# Patient Record
Sex: Female | Born: 1995 | Race: Black or African American | Hispanic: No | Marital: Single | Smoking: Never smoker
Health system: Southern US, Community
[De-identification: ages and names within clinical notes are randomized; demographics above are authoritative.]

---
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
## 2014-03-11 ENCOUNTER — Ambulatory Visit: Payer: Self-pay | Admitting: Family Medicine

## 2015-08-28 ENCOUNTER — Emergency Department (HOSPITAL_BASED_OUTPATIENT_CLINIC_OR_DEPARTMENT_OTHER)
Admission: EM | Admit: 2015-08-28 | Discharge: 2015-08-28 | Disposition: A | Discharge status: Home / Self Care | Payer: Medicaid Other | Attending: Emergency Medicine | Admitting: Emergency Medicine

## 2015-08-28 ENCOUNTER — Encounter (HOSPITAL_BASED_OUTPATIENT_CLINIC_OR_DEPARTMENT_OTHER): Payer: Self-pay

## 2015-08-28 DIAGNOSIS — M654 Radial styloid tenosynovitis [de Quervain]: Secondary | ICD-10-CM | POA: Insufficient documentation

## 2015-08-28 MED ORDER — NAPROXEN 500 MG PO TABS: 500.0000 mg | ORAL_TABLET | Freq: Two times a day (BID) | ORAL | Status: DC

## 2015-08-28 MED ORDER — NAPROXEN 250 MG PO TABS
500.0000 mg | ORAL_TABLET | Freq: Once | ORAL | Status: AC
  Administered 2015-08-28: 500 mg via ORAL
  Filled 2015-08-28: qty 2

## 2015-08-28 NOTE — ED Provider Notes (Signed)
CSN: [id number]     Arrival date & time 08/28/15  1342 History   First MD Initiated Contact with Patient 08/28/15 1348     Chief Complaint  Patient presents with  . Hand Pain     (Consider location/radiation/quality/duration/timing/severity/associated sxs/prior Treatment) The history is provided by the patient and medical records. No language interpreter was used.     Denise Kelley is a 20 y.o. female  with no major medical problems presents to the Emergency Department complaining of gradual, persistent, progressively worsening left hand pain onset 3 days ago.  Pt reports pain is moderate, located in the [facility] joint of the left thumb and worse with movement and palpation. Pt denies numbness or tinging to the fingers. She denies known trauma or injury.  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 She reports she has had intermittent episodes previously always occuring after several days of work in a row.  She reports the same happened this time as well, but her hand pain did not resolve spontaneously as it previously had.  No treatments prior to arrival. Patient denies fever, chills, joint swelling, numbness, tingling of the fingers, weakness of the hand or dropping objects. Patient is right-handed.  History reviewed. No pertinent past medical history. History reviewed. No pertinent past surgical history. No family history on file. Social History  Substance Use Topics  . Smoking status: Never Smoker   . Smokeless tobacco: None  . Alcohol Use: No   OB History    No data available     Review of Systems  Constitutional: Negative for fever and chills.  Gastrointestinal: Negative for nausea and vomiting.  Musculoskeletal: Positive for arthralgias (left thumb). Negative for back pain, joint swelling, neck pain and neck stiffness.  Skin: Negative for wound.  Neurological: Negative for numbness.  Hematological: Does not bruise/bleed easily.   Psychiatric/Behavioral: The patient is not nervous/anxious.   All other systems reviewed and are negative.     Allergies  Review of patient's allergies indicates no known allergies.  Home Medications   Prior to Admission medications   Medication Sig Start Date End Date Taking? Authorizing Provider  AMOXICILLIN PO Take 875 mg by mouth 2 (two) times daily. Ear infection x 1 week    Historical Provider, MD  diclofenac (VOLTAREN) 75 MG EC tablet Take 75 mg by mouth 2 (two) times daily.      Historical Provider, MD  naproxen (NAPROSYN) 500 MG tablet Take 1 tablet (500 mg total) by mouth 2 (two) times daily with a meal. 08/28/15   Cassey Bacigalupo, PA-C   BP 123/80 mmHg  Pulse 82  Temp(Src) 98.5 F (36.9 C) (Oral)  Resp 15  Ht 5' 9.5" (1.765 m)  Wt 74.844 kg  BMI 24.03 kg/m2  SpO2 100% Physical Exam  Constitutional: She appears well-developed and well-nourished. No distress.  HENT:  Head: Normocephalic and atraumatic.  Eyes: Conjunctivae are normal.  Neck: Normal range of motion.  Cardiovascular: Normal rate, regular rhythm and intact distal pulses.   Capillary refill < 3 sec  Pulmonary/Chest: Effort normal and breath sounds normal.  Musculoskeletal: She exhibits tenderness. She exhibits no edema.  ROM: FROM of the IP, MCP and CMC of the left thumb with pain on ROM and palpation of the CMC joint.  No swelling, erythema or increased warmth to the joints.  No swelling of the thenar eminence.   Negative Phalen's and Tinel's. Positive Finklestein's test and TTP along the  tendons of the extensor pollicis brevis and abductor pollicis longus  Neurological: She is alert. Coordination normal.  Sensation intact to dull and sharp in the left hand and arm Strength 5/5 in the left hand including flexion, extension and grip strength  Skin: Skin is warm and dry. She is not diaphoretic.  No tenting of the skin  Psychiatric: She has a normal mood and affect.  Nursing note and vitals  reviewed.   ED Course  Procedures (including critical care time)  MDM   Final diagnoses:  De Quervain's tenosynovitis, left   Denise Kelley presents with pain over the [facility] joint of the left thumb. Clinical findings are consistent with de Quervain's tenosynovitis. No evidence of carpal tunnel. No trauma, highly doubt fracture. No x-ray indicated at this time.  Patient denies IV drug use; no indication to suggest septic joint. Patient will be given naproxen and thumb spica. She is to follow-up with sports medicine.  Dahlia ClientHannah Shaketha Jeon, PA-C 08/28/15 1712  Jacalyn LefevreJulie Haviland, MD 08/29/15 [phone]

## 2015-08-28 NOTE — Discharge Instructions (Signed)
1. Medications: alternate naprosyn for pain control, usual home medications 2. Treatment: rest, ice, elevate and use brace, drink plenty of fluids, gentle stretching 3. Follow Up: Please followup with orthopedics as directed or your PCP in 1 week if no improvement for discussion of your diagnoses and further evaluation after today's visit; if you do not have a primary care doctor use the resource guide provided to find one; Please return to the ER for worsening symptoms or other concerns

## 2015-08-28 NOTE — ED Notes (Signed)
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

## 2016-04-25 ENCOUNTER — Ambulatory Visit: Payer: Self-pay | Admitting: Family Medicine

## 2016-04-29 ENCOUNTER — Encounter: Payer: Self-pay | Admitting: Family Medicine

## 2016-04-29 ENCOUNTER — Ambulatory Visit (INDEPENDENT_AMBULATORY_CARE_PROVIDER_SITE_OTHER): Payer: BLUE CROSS/BLUE SHIELD | Admitting: Family Medicine

## 2016-04-29 VITALS — BP 119/84 | HR 93 | Temp 98.4°F | Ht 69.5 in | Wt 181.2 lb

## 2016-04-29 DIAGNOSIS — L309 Dermatitis, unspecified: Secondary | ICD-10-CM

## 2016-04-29 DIAGNOSIS — M25561 Pain in right knee: Secondary | ICD-10-CM | POA: Diagnosis not present

## 2016-04-29 DIAGNOSIS — G8929 Other chronic pain: Secondary | ICD-10-CM

## 2016-04-29 DIAGNOSIS — M25562 Pain in left knee: Secondary | ICD-10-CM

## 2016-04-29 DIAGNOSIS — N926 Irregular menstruation, unspecified: Secondary | ICD-10-CM

## 2016-04-29 DIAGNOSIS — Z130 Encounter for screening for diseases of the blood and blood-forming organs and certain disorders involving the immune mechanism: Secondary | ICD-10-CM

## 2016-04-29 DIAGNOSIS — Z23 Encounter for immunization: Secondary | ICD-10-CM | POA: Diagnosis not present

## 2016-04-29 DIAGNOSIS — Z13220 Encounter for screening for lipoid disorders: Secondary | ICD-10-CM

## 2016-04-29 DIAGNOSIS — R700 Elevated erythrocyte sedimentation rate: Secondary | ICD-10-CM

## 2016-04-29 DIAGNOSIS — Z131 Encounter for screening for diabetes mellitus: Secondary | ICD-10-CM | POA: Diagnosis not present

## 2016-04-29 DIAGNOSIS — Z113 Encounter for screening for infections with a predominantly sexual mode of transmission: Secondary | ICD-10-CM

## 2016-04-29 DIAGNOSIS — Z789 Other specified health status: Secondary | ICD-10-CM

## 2016-04-29 LAB — POCT URINE PREGNANCY: PREG TEST UR: NEGATIVE

## 2016-04-29 LAB — COMPREHENSIVE METABOLIC PANEL
ALBUMIN: 4.4 g/dL (ref 3.5–5.2)
ALT: 12 U/L (ref 0–35)
AST: 16 U/L (ref 0–37)
Alkaline Phosphatase: 62 U/L (ref 39–117)
BILIRUBIN TOTAL: 0.4 mg/dL (ref 0.2–1.2)
BUN: 8 mg/dL (ref 6–23)
CALCIUM: 9.6 mg/dL (ref 8.4–10.5)
CO2: 28 mEq/L (ref 19–32)
CREATININE: 0.8 mg/dL (ref 0.40–1.20)
Chloride: 102 mEq/L (ref 96–112)
GFR: 115.07 mL/min (ref >60.00)
Glucose, Bld: 92 mg/dL (ref 70–99)
Potassium: 3.5 mEq/L (ref 3.5–5.1)
Sodium: 138 mEq/L (ref 135–145)
TOTAL PROTEIN: 8 g/dL (ref 6.0–8.3)

## 2016-04-29 LAB — LIPID PANEL
CHOLESTEROL: 164 mg/dL (ref 0–200)
HDL: 29.9 mg/dL — ABNORMAL LOW (ref >39.00)
LDL Cholesterol: 116 mg/dL — ABNORMAL HIGH (ref 0–99)
NonHDL: 134.17
TRIGLYCERIDES: 89 mg/dL (ref 0.0–149.0)
Total CHOL/HDL Ratio: 5
VLDL: 17.8 mg/dL (ref 0.0–40.0)

## 2016-04-29 LAB — SEDIMENTATION RATE: SED RATE: 23 mm/h — AB (ref 0–20)

## 2016-04-29 LAB — RHEUMATOID FACTOR

## 2016-04-29 LAB — CBC
HCT: 39.1 % (ref 36.0–46.0)
Hemoglobin: 12.9 g/dL (ref 12.0–15.0)
MCHC: 33.1 g/dL (ref 30.0–36.0)
MCV: 80.2 fl (ref 78.0–100.0)
PLATELETS: 302 10*3/uL (ref 150.0–400.0)
RBC: 4.87 Mil/uL (ref 3.87–5.11)
RDW: 15.9 % — ABNORMAL HIGH (ref 11.5–15.5)
WBC: 5.3 10*3/uL (ref 4.0–10.5)

## 2016-04-29 LAB — C-REACTIVE PROTEIN: CRP: 0.4 mg/dL — AB (ref 0.5–20.0)

## 2016-04-29 MED ORDER — HYDROCORTISONE 2.5 % EX OINT: TOPICAL_OINTMENT | Freq: Two times a day (BID) | CUTANEOUS | 2 refills | Status: DC

## 2016-04-29 NOTE — Patient Instructions (Addendum)
It was very nice to see you today.  I will be in touch with your labs asap We will look for any cause of your joint pains in your labs.  Assuming all looks well we can have you try physical therapy if you like for your joints You got your tetanus booster today (good for 10 years) and you flu shot  Please schedule a visit to see me for your pap at your convenience.   However this only needs to be done every 3 years per the latest recommendations so this is not an emergency.    Take care!
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
## 2016-04-30 ENCOUNTER — Encounter: Payer: Self-pay | Admitting: Family Medicine

## 2016-04-30 DIAGNOSIS — Z789 Other specified health status: Secondary | ICD-10-CM | POA: Insufficient documentation

## 2016-04-30 LAB — HEPATITIS B SURFACE ANTIGEN: HEP B S AG: NEGATIVE

## 2016-04-30 LAB — HIV ANTIBODY (ROUTINE TESTING W REFLEX): HIV 1&2 Ab, 4th Generation: NONREACTIVE

## 2016-04-30 LAB — HEPATITIS C ANTIBODY: HCV AB: NEGATIVE

## 2016-04-30 LAB — RPR

## 2016-04-30 LAB — HEPATITIS B SURFACE ANTIBODY, QUANTITATIVE: Hepatitis B-Post: 52.9 m[IU]/mL

## 2016-04-30 NOTE — Progress Notes (Signed)
Results for orders placed or performed in visit on 04/29/16  CBC  Result Value Ref Range   WBC 5.3 4.0 - 10.5 K/uL   RBC 4.87 3.87 - 5.11 Mil/uL   Platelets 302.0 150.0 - 400.0 K/uL   Hemoglobin 12.9 12.0 - 15.0 g/dL   HCT 16.139.1 09.636.0 - 04.546.0 %   MCV 80.2 78.0 - 100.0 fl   MCHC 33.1 30.0 - 36.0 g/dL   RDW 40.915.9 (H) 81.111.5 - 91.415.5 %  Comprehensive metabolic panel  Result Value Ref Range   Sodium 138 135 - 145 mEq/L   Potassium 3.5 3.5 - 5.1 mEq/L   Chloride 102 96 - 112 mEq/L   CO2 28 19 - 32 mEq/L   Glucose, Bld 92 70 - 99 mg/dL   BUN 8 6 - 23 mg/dL   Creatinine, Ser 7.820.80 0.40 - 1.20 mg/dL   Total Bilirubin 0.4 0.2 - 1.2 mg/dL   Alkaline Phosphatase 62 39 - 117 U/L   AST 16 0 - 37 U/L   ALT 12 0 - 35 U/L   Total Protein 8.0 6.0 - 8.3 g/dL   Albumin 4.4 3.5 - 5.2 g/dL   Calcium 9.6 8.4 - 95.610.5 mg/dL   GFR 213.08115.07 >65.78>60.00 mL/min  Lipid panel  Result Value Ref Range   Cholesterol 164 0 - 200 mg/dL   Triglycerides 46.989.0 0.0 - 149.0 mg/dL   HDL 62.9529.90 (L) >28.41>39.00 mg/dL   VLDL 32.417.8 0.0 - 40.140.0 mg/dL   LDL Cholesterol 027116 (H) 0 - 99 mg/dL   Total CHOL/HDL Ratio 5    NonHDL 134.17   C-reactive protein  Result Value Ref Range   CRP 0.4 (L) 0.5 - 20.0 mg/dL  Rheumatoid Factor  Result Value Ref Range   Rhuematoid fact SerPl-aCnc <14 <14 IU/mL  Sedimentation rate  Result Value Ref Range   Sed Rate 23 (H) 0 - 20 mm/hr  Hepatitis B surface antibody  Result Value Ref Range   Hepatitis B-Post 52.9 mIU/mL  Hepatitis B surface antigen  Result Value Ref Range   Hepatitis B Surface Ag NEGATIVE NEGATIVE  Hepatitis C antibody  Result Value Ref Range   HCV Ab NEGATIVE NEGATIVE  HIV antibody  Result Value Ref Range   HIV 1&2 Ab, 4th Generation NONREACTIVE NONREACTIVE  RPR  Result Value Ref Range   RPR Ser Ql NON REAC NON REAC  POCT urine pregnancy  Result Value Ref Range   Preg Test, Ur Negative Negative

## 2016-04-30 NOTE — Addendum Note (Signed)
Addended by: Abbe AmsterdamOPLAND, Rommie Dunn C on: 04/30/2016 07:59 AM   Modules accepted: Orders

## 2016-05-07 ENCOUNTER — Telehealth: Payer: Self-pay | Admitting: Family Medicine

## 2016-05-07 NOTE — Telephone Encounter (Signed)
Attempted to contact patient 2/6 @ 11:00 A.M. @ [phone] to inform her that Dr. Patsy Lageropland will be out of the office on 2/15 and  we need to reschedule her appointment. Telephone number was not accepting calls. Could not leave message.

## 2016-05-16 ENCOUNTER — Ambulatory Visit: Payer: BLUE CROSS/BLUE SHIELD | Admitting: Family Medicine

## 2016-06-05 ENCOUNTER — Ambulatory Visit (INDEPENDENT_AMBULATORY_CARE_PROVIDER_SITE_OTHER): Payer: BLUE CROSS/BLUE SHIELD | Admitting: Family Medicine

## 2016-06-05 ENCOUNTER — Ambulatory Visit: Payer: BLUE CROSS/BLUE SHIELD | Admitting: Family Medicine

## 2016-06-05 ENCOUNTER — Other Ambulatory Visit (HOSPITAL_COMMUNITY)
Admission: RE | Admit: 2016-06-05 | Discharge: 2016-06-05 | Disposition: A | Discharge status: Home / Self Care | Payer: BLUE CROSS/BLUE SHIELD | Source: Ambulatory Visit | Attending: Family Medicine | Admitting: Family Medicine

## 2016-06-05 VITALS — BP 126/82 | HR 86 | Temp 98.8°F | Ht 69.5 in | Wt 182.4 lb

## 2016-06-05 DIAGNOSIS — Z124 Encounter for screening for malignant neoplasm of cervix: Secondary | ICD-10-CM

## 2016-06-05 DIAGNOSIS — Z113 Encounter for screening for infections with a predominantly sexual mode of transmission: Secondary | ICD-10-CM | POA: Diagnosis not present

## 2016-06-05 DIAGNOSIS — Z01419 Encounter for gynecological examination (general) (routine) without abnormal findings: Secondary | ICD-10-CM | POA: Insufficient documentation

## 2016-06-05 NOTE — Progress Notes (Signed)
[facility] [address], [address] [place], [place] [address] [phone] [phone]  Date:  06/05/2016   Name:  Denise ReamerMarissa S Callies   DOB:  [birth year]   MRN:  [id number]  PCP:  Abbe AmsterdamOPLAND,Esty Ahuja, MD    Chief Complaint: Gynecologic Exam (Pt here for PAP)   History of Present Illness:  Denise Kelley is a 21 y.o. very pleasant female patient who presents with the following:  Here today for a follow-up visit and pap - last visit HPI as follows:  Here today as a new patient to establish care.  She just graduated from [employer]- she is getting an Set designerMBA from [employer] She needs a flu shot Her last tetanus was in 2008- tdap. Will update with td today  Her last pap was in 2016- never had an abnl pap Her LMP was in early December. She is SA- they use condoms for contraception She likes using condoms and does not wish to have another form of contraception at this time  Here today seeking a routine pap smear She has no vaginal or urinary symptoms or other concerns at this time LMP was 05/21/16 She has no concerns about her breasts  She is currently on spring break from school and [place]  Patient Active Problem List   Diagnosis Date Noted  . Hepatitis B immune 04/30/2016  . Eczema 04/29/2016    No past medical history on file.  No past surgical history on file.  Social History  Substance Use Topics  . Smoking status: Never Smoker  . Smokeless tobacco: Not on file  . Alcohol use No    Family History  Problem Relation Age of Onset  . Hypertension Mother   . Migraines Father   . Breast cancer Paternal Grandmother     No Known Allergies  Medication list has been reviewed and updated.  Current Outpatient Prescriptions on File Prior to Visit  Medication Sig Dispense Refill  . hydrocortisone 2.5 % ointment Apply topically 2 (two) times daily. Use as needed for eczema 90 g 2   No  current facility-administered medications on file prior to visit.     Review of Systems:  As per HPI- otherwise negative.   Physical Examination: Vitals:   06/05/16 1004  BP: 126/82  Pulse: 86  Temp: 98.8 F (37.1 C)   Vitals:   06/05/16 1004  Weight: 182 lb 6.4 oz (82.7 kg)  Height: 5' 9.5" (1.765 m)   Body mass index is 26.55 kg/m. Ideal Body Weight: Weight in (lb) to have BMI = 25: 171.4  GEN: WDWN, NAD, Non-toxic, A & O x 3,tall build, looks well HEENT: Atraumatic, Normocephalic. Neck supple. No masses, No LAD. Ears and Nose: No external deformity. CV: RRR, No M/G/R. No JVD. No thrill. No extra heart sounds. PULM: CTA B, no wheezes, crackles, rhonchi. No retractions. No resp. distress. No accessory muscle use. ABD: S, NT, ND EXTR: No c/c/e NEURO Normal gait.  PSYCH: Normally interactive. Conversant. Not depressed or anxious appearing.  Calm demeanor.  Pelvic: normal, no vaginal lesions or discharge. Uterus normal, no CMT, no adnexal tendereness or masses  Assessment and Plan: Screening for cervical cancer - Plan: Cytology - PAP  Pap today- will be in touch with her pending results Did gonorrhea and chlamydia screening as well  Signed Abbe AmsterdamJessica Ionia Schey, MD

## 2016-06-05 NOTE — Patient Instructions (Signed)
Good to see you today- enjoy your spring break!  I will be in touch with your pap asap

## 2016-06-07 LAB — CYTOLOGY - PAP
Chlamydia: NEGATIVE
DIAGNOSIS: NEGATIVE
NEISSERIA GONORRHEA: NEGATIVE

## 2016-06-08 ENCOUNTER — Encounter: Payer: Self-pay | Admitting: Family Medicine

## 2016-10-15 ENCOUNTER — Telehealth: Payer: Self-pay | Admitting: Family Medicine

## 2016-10-15 ENCOUNTER — Ambulatory Visit (INDEPENDENT_AMBULATORY_CARE_PROVIDER_SITE_OTHER)
Admission: RE | Admit: 2016-10-15 | Discharge: 2016-10-15 | Disposition: A | Discharge status: Home / Self Care | Payer: BLUE CROSS/BLUE SHIELD | Source: Ambulatory Visit | Attending: Family Medicine | Admitting: Family Medicine

## 2016-10-15 ENCOUNTER — Ambulatory Visit (INDEPENDENT_AMBULATORY_CARE_PROVIDER_SITE_OTHER): Payer: BLUE CROSS/BLUE SHIELD | Admitting: Family Medicine

## 2016-10-15 ENCOUNTER — Encounter: Payer: Self-pay | Admitting: Family Medicine

## 2016-10-15 ENCOUNTER — Encounter: Payer: Self-pay | Admitting: *Deleted

## 2016-10-15 VITALS — BP 100/60 | HR 92 | Temp 98.8°F | Ht 69.5 in | Wt 189.4 lb

## 2016-10-15 DIAGNOSIS — M791 Myalgia, unspecified site: Secondary | ICD-10-CM

## 2016-10-15 DIAGNOSIS — R0789 Other chest pain: Secondary | ICD-10-CM

## 2016-10-15 DIAGNOSIS — F419 Anxiety disorder, unspecified: Secondary | ICD-10-CM | POA: Diagnosis not present

## 2016-10-15 DIAGNOSIS — R0602 Shortness of breath: Secondary | ICD-10-CM | POA: Diagnosis not present

## 2016-10-15 LAB — CBC
HCT: 38.1 % (ref 36.0–46.0)
Hemoglobin: 12.5 g/dL (ref 12.0–15.0)
MCHC: 32.9 g/dL (ref 30.0–36.0)
MCV: 80.6 fl (ref 78.0–100.0)
PLATELETS: 303 10*3/uL (ref 150.0–400.0)
RBC: 4.72 Mil/uL (ref 3.87–5.11)
RDW: 15.8 % — AB (ref 11.5–15.5)
WBC: 7.1 10*3/uL (ref 4.0–10.5)

## 2016-10-15 LAB — TSH: TSH: 1.72 u[IU]/mL (ref 0.35–4.50)

## 2016-10-15 MED ORDER — ALBUTEROL SULFATE HFA 108 (90 BASE) MCG/ACT IN AERS: 2.0000 | INHALATION_SPRAY | Freq: Four times a day (QID) | RESPIRATORY_TRACT | 0 refills | Status: DC | PRN

## 2016-10-15 NOTE — Telephone Encounter (Signed)
[facility] Day - Client TELEPHONE ADVICE RECORD [facility] Patient Name: Denise Kelley DOB: 06/14/1995 Initial Comment Caller states c/o chest tightness and shortness of breath. Nurse Assessment Nurse: Lane HackerHarley, RN, Elvin SoWindy Date/Time (Eastern Time): 10/15/2016 9:02:41 AM Confirm and document reason for call. If symptomatic, describe symptoms. ---Caller states c/o chest tightness and shortness of breath off/on for last 2 wks ago. This episode started this AM, and still feels it now. Does the patient have any new or worsening symptoms? ---Yes Will a triage be completed? ---Yes Related visit to physician within the last 2 weeks? ---No Does the PT have any chronic conditions? (i.e. diabetes, asthma, etc.) ---No Is the patient pregnant or possibly pregnant? (Ask all females between the ages of 112-55) ---No Is this a behavioral health or substance abuse call? ---No Guidelines Guideline Title Affirmed Question Affirmed Notes Chest Pain Chest pain lasts > 5 minutes (Exceptions: chest pain occurring > 3 days ago and now asymptomatic; same as previously diagnosed heartburn and has accompanying sour taste in mouth) Final Disposition User Go to ED Now (or PCP triage) Lane HackerHarley, RN, Elvin SoWindy Comments Denies difficulty breathing now. [facility] as [facility]. Disagree/Comply: Comply

## 2016-10-15 NOTE — Progress Notes (Signed)
HPI:  Acute visit for chest discomfort: -intermittent for 2 weeks, tightness in upper chest, no symptoms today -usually occur in the morning a rest, occasionally when walking to class -under a lot of stress with working and school, has anxiety and occ panic -feels mild SOB at times as well -no medications, recent immobility, recent change in activities, CP, dizziness, fever, cough, hemoptysis, malaise, depression or SI, palpitations -hx irr periods, denies any chance pregnancy, FDLMP end of June -denies hx asthma or allergies   ROS: See pertinent positives and negatives per HPI.  No past medical history on file.  No past surgical history on file.  Family History  Problem Relation Age of Onset  . Hypertension Mother   . Migraines Father   . Breast cancer Paternal Grandmother     Social History   Social History  . Marital status: Single    Spouse name: N/A  . Number of children: N/A  . Years of education: N/A   Social History Main Topics  . Smoking status: Never Smoker  . Smokeless tobacco: Never Used  . Alcohol use No  . Drug use: No  . Sexual activity: Not Asked   Other Topics Concern  . None   Social History Narrative  . None     Current Outpatient Prescriptions:  .  hydrocortisone 2.5 % ointment, Apply topically 2 (two) times daily. Use as needed for eczema, Disp: 90 g, Rfl: 2 .  albuterol (PROVENTIL HFA;VENTOLIN HFA) 108 (90 Base) MCG/ACT inhaler, Inhale 2 puffs into the lungs every 6 (six) hours as needed., Disp: 1 Inhaler, Rfl: 0  EXAM:  Vitals:   10/15/16 1034  BP: 100/60  Pulse: 92  Temp: 98.8 F (37.1 C)    Body mass index is 27.57 kg/m.  GENERAL: vitals reviewed and listed above, alert, oriented, appears well hydrated and in no acute distress  HEENT: atraumatic, conjunttiva clear, no obvious abnormalities on inspection of external nose and ears, normal appearance of ear canals and TMs, clear nasal congestion, boggy turbinates, mild post  oropharyngeal erythema with PND, no tonsillar edema or exudate, no sinus TTP  NECK: no obvious masses on inspection  LUNGS: clear to auscultation bilaterally, no wheezes, rales or rhonchi, good air movement  CV: HRRR, no peripheral edema  MS: moves all extremities without noticeable abnormality, TTP in upper chest wall muscles bilaterally in area of concern  PSYCH: pleasant and cooperative, no obvious depression or anxiety  ASSESSMENT AND PLAN:  Discussed the following assessment and plan:  Chest tightness - Plan: EKG 12-Lead, TSH  SOB (shortness of breath) - Plan: CBC, DG Chest 2 View  Anxiety  Muscle soreness  -we discussed possible serious and likely etiologies, workup and treatment, treatment risks and return precautions -some reproducible chest wall muscle soreness, anxiety and findings suggesting allergies or VURI on exam - all which could be contributing -after this discussion, Denise Kelley opted for: EKG (NSR on interpretation), CXR, CBC, TSH; zyrtec and trial prn alb in case viral or allergic related RAD, consideration CBT - options discussed and resources provided, stretching and close follow up with PCP -follow up advised in 2 weeks with PCP -of course, we advised Denise Kelley  to return or notify a doctor immediately if symptoms worsen or persist or new concerns arise.   Patient Instructions  BEFORE YOU LEAVE: -EKG, xray sheet -brochure for counseling to help with stress/anxiety -labs -follow up: with PCP in 2 weeks  We have ordered labs or studies at this visit. It can  take up to 1-2 weeks for results and processing. IF results require follow up or explanation, we will call you with instructions. Clinically stable results will be released to your [facility]. If you have not heard from us or cannot find your results in [facility] in 2 weeks please contact our office at [phone].  If you are not yet signed up for [facility], please consider signing up.  Consider counseling to  help with anxiety and stress - your school may also offer this.  Take zyrtec daily at night and try the albuterol if any further chest tightness to see if this helps.  Stretching for sore muscles.  I hope you are feeling better soon! Seek care sooner if worsening, new concerns or you are not improving with treatment.     Kriste BasqueKIM, Denise Kelley R., DO

## 2016-10-15 NOTE — Patient Instructions (Signed)
BEFORE YOU LEAVE: -EKG, xray sheet -brochure for counseling to help with stress/anxiety -labs -follow up: with PCP in 2 weeks  We have ordered labs or studies at this visit. It can take up to 1-2 weeks for results and processing. IF results require follow up or explanation, we will call you with instructions. Clinically stable results will be released to your [facility]MYCHART. If you have not heard from us or cannot find your results in [facility] in 2 weeks please contact our office at [phone].  If you are not yet signed up for [facility], please consider signing up.  Consider counseling to help with anxiety and stress - your school may also offer this.  Take zyrtec daily at night and try the albuterol if any further chest tightness to see if this helps.  Stretching for sore muscles.  I hope you are feeling better soon! Seek care sooner if worsening, new concerns or you are not improving with treatment.

## 2016-10-15 NOTE — Telephone Encounter (Signed)
Error

## 2017-06-08 NOTE — Progress Notes (Addendum)
[facility] [address], [address] [place], [place] [address] [phone] [phone]  Date:  06/09/2017   Name:  Denise Kelley   DOB:  Mar 25, 1996   MRN:  [id number]  PCP:  Pearline Cablesopland, [place] C, MD    Chief Complaint: Annual Exam (Pt here for CPE with PAP. )   History of Present Illness:  Denise Kelley is a 22 y.o. 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  She is graduating with her masters this spring from [employer]  She wanted an HCG today as she may have missed a cycle- she had her LMP 2/1- 2/5- sometimes she can be irregular She is using condoms right now for contraception- she uses them not all the time however.  She does not wish to be on any other form on contraception at this time. Discussed with her in detail and advised that   She is fasting today-  She started exercising recently  Noted low HDL last year   She notes that she tends to have a lot of nasal discharge/ congestion and PND.  Will have her try atrovent nasal   Patient Active Problem List   Diagnosis Date Noted  . Hepatitis B immune 04/30/2016  . Eczema 04/29/2016    History reviewed. No pertinent past medical history.  History reviewed. No pertinent surgical history.  Social History   Tobacco Use  . Smoking status: Never Smoker  . Smokeless tobacco: Never Used  Substance Use Topics  . Alcohol use: No  . Drug use: No    Family History  Problem Relation Age of Onset  . Hypertension Mother   . Migraines Father   . Breast cancer Paternal Grandmother     No Known Allergies  Medication list has been reviewed and updated.  No current outpatient medications on file prior to visit.   No current facility-administered medications on file prior to visit.      Review of Systems:  As per HPI- otherwise negative.   Physical Examination: Vitals:   06/09/17 1040  BP: 139/82  Pulse: 82  Temp: 98.7 F (37.1 C)  SpO2: 100%   Vitals:   06/09/17 1040  Weight: 194 lb (88 kg)  Height: 5\' 10"  (1.778 m)   Body mass index is 27.84 kg/m. Ideal Body Weight: Weight in (lb) to have BMI = 25: 173.9  GEN: WDWN, NAD, Non-toxic, A & O x 3, looks well, normal weight  HEENT: Atraumatic, Normocephalic. Neck supple. No masses, No LAD.  Bilateral TM wnl, oropharynx normal.  PEERL,EOMI.   Ears and Nose: No external deformity. CV: RRR, No M/G/R. No JVD. No thrill. No extra heart sounds. PULM: CTA B, no wheezes, crackles, rhonchi. No retractions. No resp. distress. No accessory muscle use. ABD: S, NT, ND, +BS. No rebound. No HSM. EXTR: No c/c/e NEURO Normal gait.  PSYCH: Normally interactive. Conversant. Not depressed or anxious appearing.  Calm demeanor.  Breast: normal exam, no masses/ dimpling/ discharge Pelvic: normal, no vaginal lesions or discharge. Uterus normal, no CMT, no adnexal tendereness or masses  Results for orders placed or performed in visit on 06/09/17  POCT urine pregnancy  Result Value Ref Range   Preg Test, Ur Negative Negative  Assessment and Plan: Physical exam  Eczema, unspecified type - Plan: hydrocortisone 2.5 % ointment  Missed period - Plan: POCT urine pregnancy  Routine screening for STI (sexually transmitted infection) - Plan: Hepatitis C antibody, HIV antibody, RPR, Cervicovaginal ancillary only  Screening for diabetes mellitus - Plan: Hemoglobin A1c  Screening for hyperlipidemia - Plan: Lipid panel  Post-nasal drainage - Plan: ipratropium (ATROVENT) 0.03 % nasal spray  CPE today Labs pending as above Will plan further follow- up pending labs. Gave her atrovent nasal to try for her nasal sx Will plan further follow- up pending labs. See patient instructions for more details.     Signed Abbe Amsterdam, MD  Received her labs 3/12  Negative for Hep C, HIV, syphilis.   Your cholesterol is overall ok, but I would like to see your HDL (good cholesterol) a bit higher.  Exercise and an omega 3 supplement can be helpful here.  Also, your A1c (average blood sugar) is in the pre-diabetes range.  This means you may be at higher risk for diabetes later on.  Again, exercise can help, as can losing just a few pounds.  Let's meet in 6 months to repeat your A1c test.   Received her remaining labs 3/12 Positive for chlamydia Message to pt again: Hi Shawn- I'm afraid your last lab came back and you do have chlamydia. This is curable with antibiotics.  Please reply to me so that I know you got this message.  Then I will send in antibiotics for you.   Also, any partner(s) needs to be treated or you will catch this again.  Both you and any partners need to complete treatment and then wait 10 days prior to intercourse.    Please just reply to me here, and please confirm the pharmacy you would like to use! JC Results for orders placed or performed in visit on 06/09/17  Hepatitis C antibody  Result Value Ref Range   Hepatitis C Ab NON-REACTIVE NON-REACTI   SIGNAL TO CUT-OFF 0.02 <1.00  HIV antibody  Result Value Ref Range   HIV 1&2 Ab, 4th Generation NON-REACTIVE NON-REACTI  RPR  Result Value Ref Range   RPR Ser Ql NON-REACTIVE NON-REACTI  Lipid panel  Result Value Ref Range   Cholesterol 140 0 - 200 mg/dL   Triglycerides 161.0 0.0 - 149.0 mg/dL   HDL 96.04 (L) >54.09 mg/dL   VLDL 81.1 0.0 - 91.4 mg/dL   LDL Cholesterol 81 0 - 99 mg/dL   Total CHOL/HDL Ratio 4    NonHDL 107.79   Hemoglobin A1c  Result Value Ref Range   Hgb A1c MFr Bld 6.2 4.6 - 6.5 %  POCT urine pregnancy  Result Value Ref Range   Preg Test, Ur Negative Negative  Cervicovaginal ancillary only  Result Value Ref Range   Chlamydia **POSITIVE** (A)    Neisseria gonorrhea Negative    Trichomonas Negative

## 2017-06-09 ENCOUNTER — Encounter: Payer: Self-pay | Admitting: Family Medicine

## 2017-06-09 ENCOUNTER — Other Ambulatory Visit (HOSPITAL_COMMUNITY)
Admission: RE | Admit: 2017-06-09 | Discharge: 2017-06-09 | Disposition: A | Discharge status: Home / Self Care | Payer: BLUE CROSS/BLUE SHIELD | Source: Ambulatory Visit | Attending: Family Medicine | Admitting: Family Medicine

## 2017-06-09 ENCOUNTER — Ambulatory Visit (INDEPENDENT_AMBULATORY_CARE_PROVIDER_SITE_OTHER): Payer: BLUE CROSS/BLUE SHIELD | Admitting: Family Medicine

## 2017-06-09 VITALS — BP 139/82 | HR 82 | Temp 98.7°F | Ht 70.0 in | Wt 194.0 lb

## 2017-06-09 DIAGNOSIS — Z113 Encounter for screening for infections with a predominantly sexual mode of transmission: Secondary | ICD-10-CM | POA: Diagnosis not present

## 2017-06-09 DIAGNOSIS — N926 Irregular menstruation, unspecified: Secondary | ICD-10-CM

## 2017-06-09 DIAGNOSIS — Z0000 Encounter for general adult medical examination without abnormal findings: Secondary | ICD-10-CM | POA: Diagnosis not present

## 2017-06-09 DIAGNOSIS — L309 Dermatitis, unspecified: Secondary | ICD-10-CM | POA: Diagnosis not present

## 2017-06-09 DIAGNOSIS — R7303 Prediabetes: Secondary | ICD-10-CM

## 2017-06-09 DIAGNOSIS — Z131 Encounter for screening for diabetes mellitus: Secondary | ICD-10-CM | POA: Diagnosis not present

## 2017-06-09 DIAGNOSIS — Z13220 Encounter for screening for lipoid disorders: Secondary | ICD-10-CM | POA: Diagnosis not present

## 2017-06-09 DIAGNOSIS — R0982 Postnasal drip: Secondary | ICD-10-CM

## 2017-06-09 LAB — LIPID PANEL
Cholesterol: 140 mg/dL (ref 0–200)
HDL: 32.3 mg/dL — AB (ref >39.00)
LDL CALC: 81 mg/dL (ref 0–99)
NONHDL: 107.79
Total CHOL/HDL Ratio: 4
Triglycerides: 136 mg/dL (ref 0.0–149.0)
VLDL: 27.2 mg/dL (ref 0.0–40.0)

## 2017-06-09 LAB — HEMOGLOBIN A1C: HEMOGLOBIN A1C: 6.2 % (ref 4.6–6.5)

## 2017-06-09 LAB — POCT URINE PREGNANCY: PREG TEST UR: NEGATIVE

## 2017-06-09 MED ORDER — IPRATROPIUM BROMIDE 0.03 % NA SOLN: NASAL | 6 refills | Status: DC

## 2017-06-09 MED ORDER — HYDROCORTISONE 2.5 % EX OINT: TOPICAL_OINTMENT | Freq: Two times a day (BID) | CUTANEOUS | 2 refills | Status: DC

## 2017-06-09 NOTE — Patient Instructions (Signed)
Great to see you today- congrats on your graduation coming up this spring!   I will be in touch with your labs asap If you don't get a period in the next week or so, do take a home pregnancy test As we discussed, you are likely quite fertile at this time in your life- if you do not wish to be pregnant, you MUST use a reliable contraceptive always!     Health Maintenance, Female Adopting a healthy lifestyle and getting preventive care can go a long way to promote health and wellness. Talk with your health care provider about what schedule of regular examinations is right for you. This is a good chance for you to check in with your provider about disease prevention and staying healthy. In between checkups, there are plenty of things you can do on your own. Experts have done a lot of research about which lifestyle changes and preventive measures are most likely to keep you healthy. Ask your health care provider for more information. Weight and diet Eat a healthy diet  Be sure to include plenty of vegetables, fruits, low-fat dairy products, and lean protein.  Do not eat a lot of foods high in solid fats, added sugars, or salt.  Get regular exercise. This is one of the most important things you can do for your health. ? Most adults should exercise for at least 150 minutes each week. The exercise should increase your heart rate and make you sweat (moderate-intensity exercise). ? Most adults should also do strengthening exercises at least twice a week. This is in addition to the moderate-intensity exercise.  Maintain a healthy weight  Body mass index (BMI) is a measurement that can be used to identify possible weight problems. It estimates body fat based on height and weight. Your health care provider can help determine your BMI and help you achieve or maintain a healthy weight.  For females 40 years of age and older: ? A BMI below 18.5 is considered underweight. ? A BMI of 18.5 to 24.9 is  normal. ? A BMI of 25 to 29.9 is considered overweight. ? A BMI of 30 and above is considered obese.  Watch levels of cholesterol and blood lipids  You should start having your blood tested for lipids and cholesterol at 24 years of age, then have this test every 5 years.  You may need to have your cholesterol levels checked more often if: ? Your lipid or cholesterol levels are high. ? You are older than 24 years of age. ? You are at high risk for heart disease.  Cancer screening Lung Cancer  Lung cancer screening is recommended for adults 62-67 years old who are at high risk for lung cancer because of a history of smoking.  A yearly low-dose CT scan of the lungs is recommended for people who: ? Currently smoke. ? Have quit within the past 15 years. ? Have at least a 30-pack-year history of smoking. A pack year is smoking an average of one pack of cigarettes a day for 1 year.  Yearly screening should continue until it has been 15 years since you quit.  Yearly screening should stop if you develop a health problem that would prevent you from having lung cancer treatment.  Breast Cancer  Practice breast self-awareness. This means understanding how your breasts normally appear and feel.  It also means doing regular breast self-exams. Let your health care provider know about any changes, no matter how small.  If you are  in your 20s or 30s, you should have a clinical breast exam (CBE) by a health care provider every 1-3 years as part of a regular health exam.  If you are 40 or older, have a CBE every year. Also consider having a breast X-ray (mammogram) every year.  If you have a family history of breast cancer, talk to your health care provider about genetic screening.  If you are at high risk for breast cancer, talk to your health care provider about having an MRI and a mammogram every year.  Breast cancer gene (BRCA) assessment is recommended for women who have family members  with BRCA-related cancers. BRCA-related cancers include: ? Breast. ? Ovarian. ? Tubal. ? Peritoneal cancers.  Results of the assessment will determine the need for genetic counseling and BRCA1 and BRCA2 testing.  Cervical Cancer Your health care provider may recommend that you be screened regularly for cancer of the pelvic organs (ovaries, uterus, and vagina). This screening involves a pelvic examination, including checking for microscopic changes to the surface of your cervix (Pap test). You may be encouraged to have this screening done every 3 years, beginning at age 21.  For women ages 30-65, health care providers may recommend pelvic exams and Pap testing every 3 years, or they may recommend the Pap and pelvic exam, combined with testing for human papilloma virus (HPV), every 5 years. Some types of HPV increase your risk of cervical cancer. Testing for HPV may also be done on women of any age with unclear Pap test results.  Other health care providers may not recommend any screening for nonpregnant women who are considered low risk for pelvic cancer and who do not have symptoms. Ask your health care provider if a screening pelvic exam is right for you.  If you have had past treatment for cervical cancer or a condition that could lead to cancer, you need Pap tests and screening for cancer for at least 20 years after your treatment. If Pap tests have been discontinued, your risk factors (such as having a new sexual partner) need to be reassessed to determine if screening should resume. Some women have medical problems that increase the chance of getting cervical cancer. In these cases, your health care provider may recommend more frequent screening and Pap tests.  Colorectal Cancer  This type of cancer can be detected and often prevented.  Routine colorectal cancer screening usually begins at 24 years of age and continues through 24 years of age.  Your health care provider may recommend  screening at an earlier age if you have risk factors for colon cancer.  Your health care provider may also recommend using home test kits to check for hidden blood in the stool.  A small camera at the end of a tube can be used to examine your colon directly (sigmoidoscopy or colonoscopy). This is done to check for the earliest forms of colorectal cancer.  Routine screening usually begins at age 50.  Direct examination of the colon should be repeated every 5-10 years through 24 years of age. However, you may need to be screened more often if early forms of precancerous polyps or small growths are found.  Skin Cancer  Check your skin from head to toe regularly.  Tell your health care provider about any new moles or changes in moles, especially if there is a change in a mole's shape or color.  Also tell your health care provider if you have a mole that is larger than the   size of a pencil eraser.  Always use sunscreen. Apply sunscreen liberally and repeatedly throughout the day.  Protect yourself by wearing long sleeves, pants, a wide-brimmed hat, and sunglasses whenever you are outside.  Heart disease, diabetes, and high blood pressure  High blood pressure causes heart disease and increases the risk of stroke. High blood pressure is more likely to develop in: ? People who have blood pressure in the high end of the normal range (130-139/85-89 mm Hg). ? People who are overweight or obese. ? People who are African American.  If you are 18-39 years of age, have your blood pressure checked every 3-5 years. If you are 40 years of age or older, have your blood pressure checked every year. You should have your blood pressure measured twice-once when you are at a hospital or clinic, and once when you are not at a hospital or clinic. Record the average of the two measurements. To check your blood pressure when you are not at a hospital or clinic, you can use: ? An automated blood pressure machine at  a pharmacy. ? A home blood pressure monitor.  If you are between 55 years and 79 years old, ask your health care provider if you should take aspirin to prevent strokes.  Have regular diabetes screenings. This involves taking a blood sample to check your fasting blood sugar level. ? If you are at a normal weight and have a low risk for diabetes, have this test once every three years after 24 years of age. ? If you are overweight and have a high risk for diabetes, consider being tested at a younger age or more often. Preventing infection Hepatitis B  If you have a higher risk for hepatitis B, you should be screened for this virus. You are considered at high risk for hepatitis B if: ? You were born in a country where hepatitis B is common. Ask your health care provider which countries are considered high risk. ? Your parents were born in a high-risk country, and you have not been immunized against hepatitis B (hepatitis B vaccine). ? You have HIV or AIDS. ? You use needles to inject street drugs. ? You live with someone who has hepatitis B. ? You have had sex with someone who has hepatitis B. ? You get hemodialysis treatment. ? You take certain medicines for conditions, including cancer, organ transplantation, and autoimmune conditions.  Hepatitis C  Blood testing is recommended for: ? Everyone born from 1945 through 1965. ? Anyone with known risk factors for hepatitis C.  Sexually transmitted infections (STIs)  You should be screened for sexually transmitted infections (STIs) including gonorrhea and chlamydia if: ? You are sexually active and are younger than 24 years of age. ? You are older than 24 years of age and your health care provider tells you that you are at risk for this type of infection. ? Your sexual activity has changed since you were last screened and you are at an increased risk for chlamydia or gonorrhea. Ask your health care provider if you are at risk.  If you do  not have HIV, but are at risk, it may be recommended that you take a prescription medicine daily to prevent HIV infection. This is called pre-exposure prophylaxis (PrEP). You are considered at risk if: ? You are sexually active and do not regularly use condoms or know the HIV status of your partner(s). ? You take drugs by injection. ? You are sexually active with a partner   who has HIV.  Talk with your health care provider about whether you are at high risk of being infected with HIV. If you choose to begin PrEP, you should first be tested for HIV. You should then be tested every 3 months for as long as you are taking PrEP. Pregnancy  If you are premenopausal and you may become pregnant, ask your health care provider about preconception counseling.  If you may become pregnant, take 400 to 800 micrograms (mcg) of folic acid every day.  If you want to prevent pregnancy, talk to your health care provider about birth control (contraception). Osteoporosis and menopause  Osteoporosis is a disease in which the bones lose minerals and strength with aging. This can result in serious bone fractures. Your risk for osteoporosis can be identified using a bone density scan.  If you are 59 years of age or older, or if you are at risk for osteoporosis and fractures, ask your health care provider if you should be screened.  Ask your health care provider whether you should take a calcium or vitamin D supplement to lower your risk for osteoporosis.  Menopause may have certain physical symptoms and risks.  Hormone replacement therapy may reduce some of these symptoms and risks. Talk to your health care provider about whether hormone replacement therapy is right for you. Follow these instructions at home:  Schedule regular health, dental, and eye exams.  Stay current with your immunizations.  Do not use any tobacco products including cigarettes, chewing tobacco, or electronic cigarettes.  If you are  pregnant, do not drink alcohol.  If you are breastfeeding, limit how much and how often you drink alcohol.  Limit alcohol intake to no more than 1 drink per day for nonpregnant women. One drink equals 12 ounces of beer, 5 ounces of wine, or 1 ounces of hard liquor.  Do not use street drugs.  Do not share needles.  Ask your health care provider for help if you need support or information about quitting drugs.  Tell your health care provider if you often feel depressed.  Tell your health care provider if you have ever been abused or do not feel safe at home. This information is not intended to replace advice given to you by your health care provider. Make sure you discuss any questions you have with your health care provider. Document Released: 10/01/2010 Document Revised: 08/24/2015 Document Reviewed: 12/20/2014 Elsevier Interactive Patient Education  [employer].

## 2017-06-10 ENCOUNTER — Encounter: Payer: Self-pay | Admitting: Family Medicine

## 2017-06-10 DIAGNOSIS — R7303 Prediabetes: Secondary | ICD-10-CM | POA: Insufficient documentation

## 2017-06-10 LAB — HIV ANTIBODY (ROUTINE TESTING W REFLEX): HIV: NONREACTIVE

## 2017-06-10 LAB — RPR: RPR Ser Ql: NONREACTIVE

## 2017-06-10 LAB — CERVICOVAGINAL ANCILLARY ONLY
CHLAMYDIA, DNA PROBE: POSITIVE — AB
Neisseria Gonorrhea: NEGATIVE
Trichomonas: NEGATIVE

## 2017-06-10 LAB — HEPATITIS C ANTIBODY
Hepatitis C Ab: NONREACTIVE
SIGNAL TO CUT-OFF: 0.02 (ref <1.00)

## 2017-06-10 MED ORDER — AZITHROMYCIN 250 MG PO TABS: ORAL_TABLET | ORAL | 0 refills | Status: DC

## 2017-06-26 ENCOUNTER — Encounter: Payer: Self-pay | Admitting: Family Medicine

## 2018-02-15 IMAGING — DX DG CHEST 2V
2 series · 2 of 2 positions shown · non-contrast
Comparison: None.

CLINICAL DATA: Shortness of Breath

EXAM:
CHEST  2 VIEW

[chest pa]
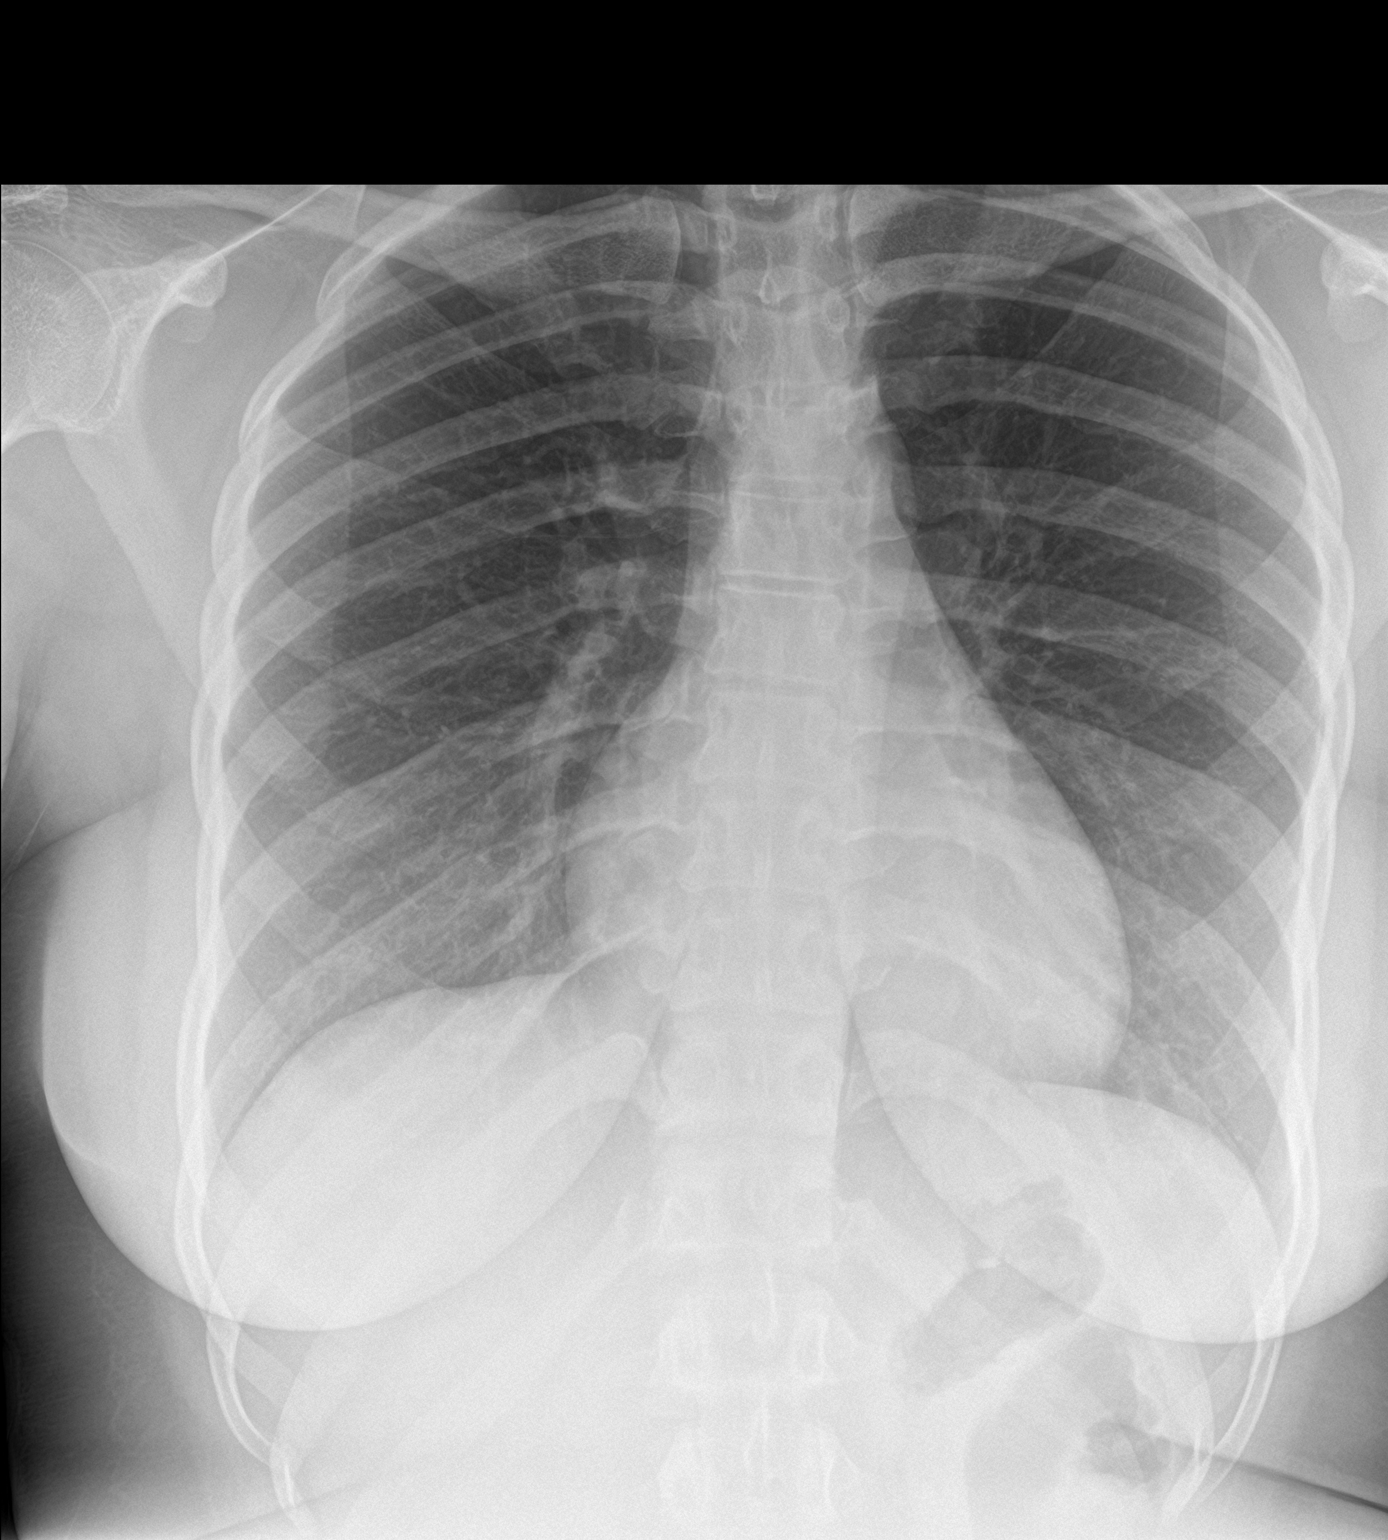

[chest lat]
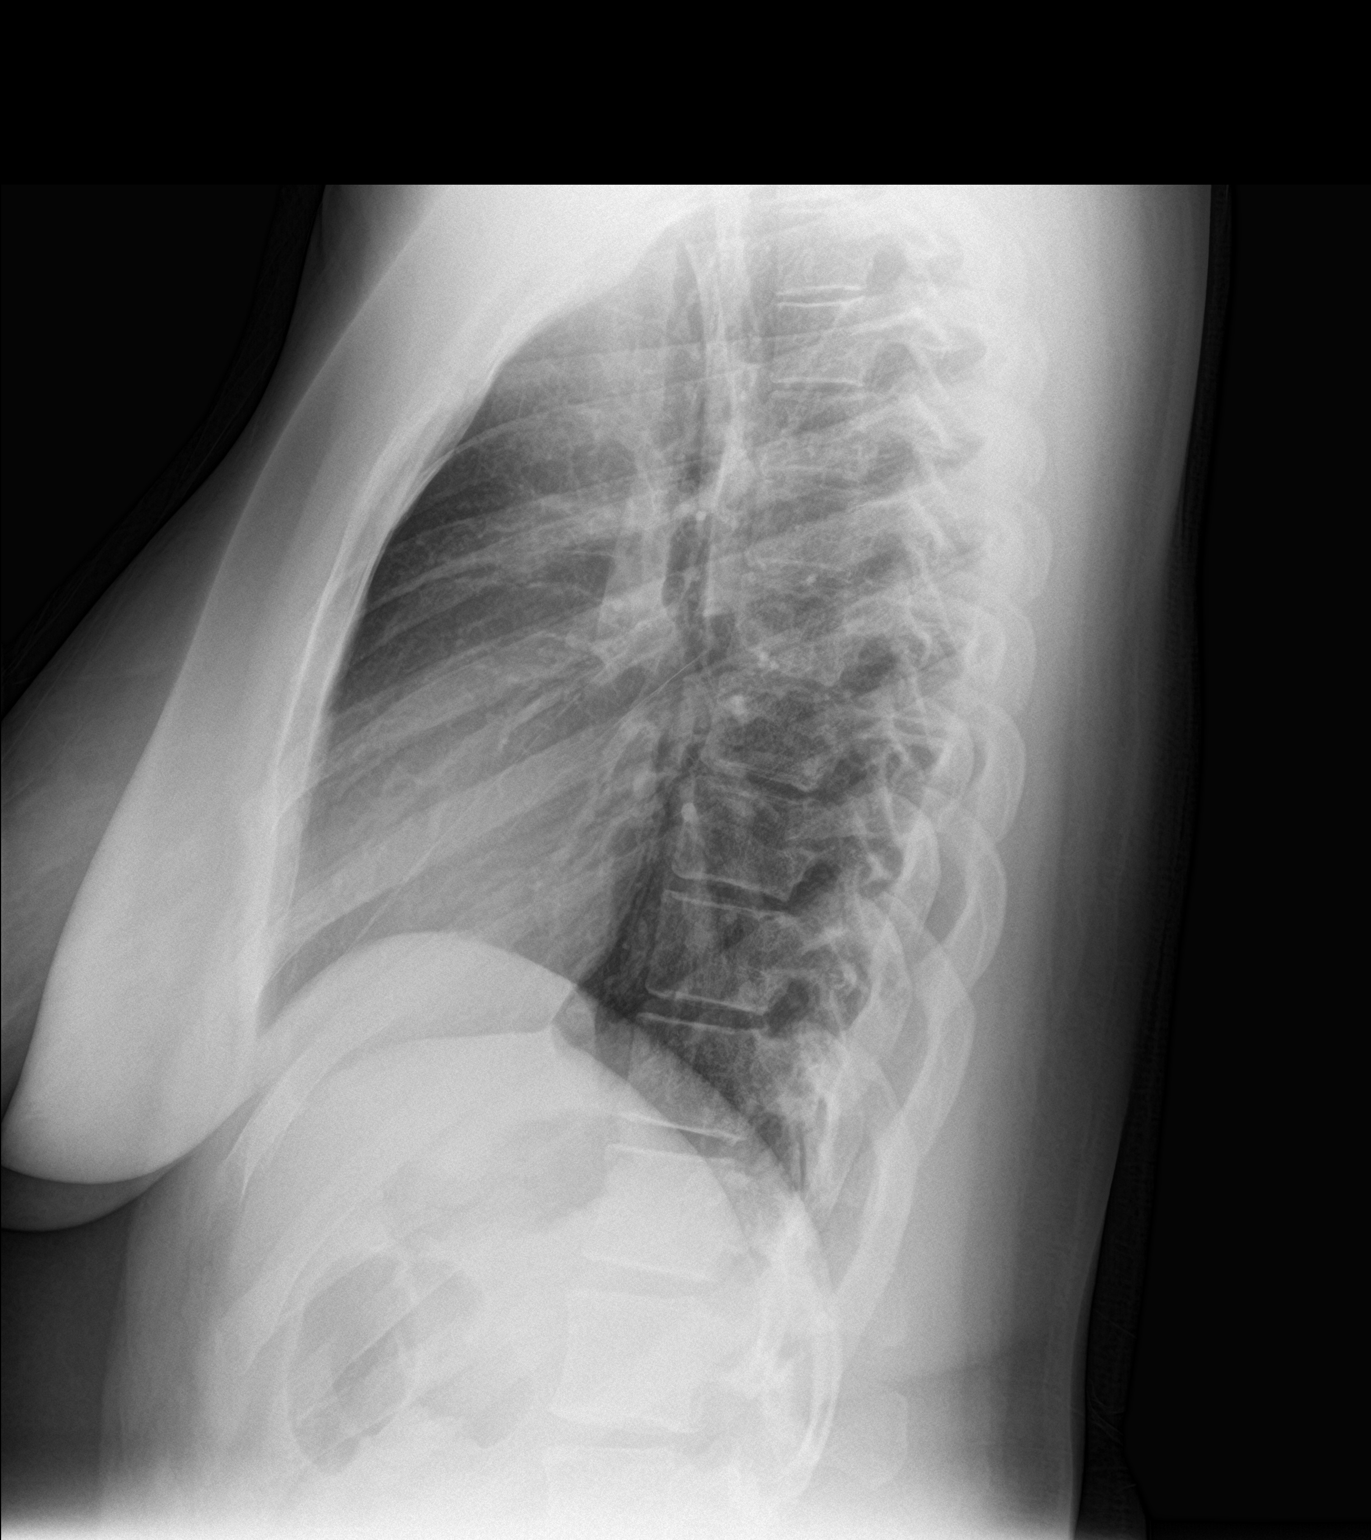

[2 of 2 positions shown; findings below may reference images not displayed]

FINDINGS: Lungs are clear. Heart size and pulmonary vascularity are normal. No
adenopathy. No pneumothorax. There is mild midthoracic
levoscoliosis.
IMPRESSION: No edema or consolidation.

## 2018-06-09 NOTE — Progress Notes (Deleted)
[facility] [address] [place], [place] [address] [phone]  Date:  06/11/2018   Name:  SHANIECE DRILL   DOB:  [birth year]   MRN:  [id number]  PCP:  Pearline Cables, MD    Chief Complaint: No chief complaint on file.   History of Present Illness:  EMA SINES is a 23 y.o. very pleasant female patient who presents with the following:  Here today for complete physical.  History of prediabetes and eczema Last seen by myself about 1 year ago-at that time she was about to graduate with a masters degree from [employer]  Immunizations: Flu shot? Pap is up-to-date, 3/18.  To repeat for today if she would like Labs: 1 year ago  Lab Results  Component Value Date   HGBA1C 6.2 06/09/2017     Patient Active Problem List   Diagnosis Date Noted  . Pre-diabetes 06/10/2017  . Hepatitis B immune 04/30/2016  . Eczema 04/29/2016    No past medical history on file.  No past surgical history on file.  Social History   Tobacco Use  . Smoking status: Never Smoker  . Smokeless tobacco: Never Used  Substance Use Topics  . Alcohol use: No  . Drug use: No    Family History  Problem Relation Age of Onset  . Hypertension Mother   . Migraines Father   . Breast cancer Paternal Grandmother     No Known Allergies  Medication list has been reviewed and updated.  Current Outpatient Medications on File Prior to Visit  Medication Sig Dispense Refill  . azithromycin (ZITHROMAX) 250 MG tablet Take 4 tablets (1gm) by mouth once 4 tablet 0  . hydrocortisone 2.5 % ointment Apply topically 2 (two) times daily. Use as needed for eczema 90 g 2  . ipratropium (ATROVENT) 0.03 % nasal spray 2 sprays each nostril as needed for post nasal drip and congestion 30 mL 6   No current facility-administered medications on file prior to visit.     Review of Systems:  As per HPI- otherwise negative.   Physical  Examination: There were no vitals filed for this visit. There were no vitals filed for this visit. There is no height or weight on file to calculate BMI. Ideal Body Weight:    GEN: WDWN, NAD, Non-toxic, A & O x 3 HEENT: Atraumatic, Normocephalic. Neck supple. No masses, No LAD. Ears and Nose: No external deformity. CV: RRR, No M/G/R. No JVD. No thrill. No extra heart sounds. PULM: CTA B, no wheezes, crackles, rhonchi. No retractions. No resp. distress. No accessory muscle use. ABD: S, NT, ND, +BS. No rebound. No HSM. EXTR: No c/c/e NEURO Normal gait.  PSYCH: Normally interactive. Conversant. Not depressed or anxious appearing.  Calm demeanor.    Assessment and Plan: ***  Signed Abbe Amsterdam, MD

## 2018-06-11 ENCOUNTER — Encounter: Payer: BLUE CROSS/BLUE SHIELD | Admitting: Family Medicine

## 2018-06-19 NOTE — Progress Notes (Addendum)
[facility] [address] [place], [place] [address] [phone]  Date:  06/22/2018   Name:  Denise Kelley   DOB:  December 17, 1995   MRN:  [id number]  PCP:  Pearline Cables, MD    Chief Complaint: Annual Exam   History of Present Illness:  Denise Kelley is a 23 y.o. very pleasant female patient who presents with the following:  Here today for routine physical Generally in good health, history of prediabetes Last seen by myself about 1 year ago At that point we noted that she had prediabetes and also chlamydia which we treated with azithromycin She graduated from [employer] last year with a masters degree, working for a [employer] production planner.  They are working from home right now due to COVID 19 outbreak  Labs: Due complete panel today, she is not fasting today  Pap: 2 years ago, can repeat today if patient would like- she declines, will do next year  Chlamydia screening:  She was tested 2 for "everything" weeks ago and all was ok  Immunizations: needs flu, will give today   She is SA, she is using no contraception LMP was last week Discussed contraception with her - right now she is using nothing Last intercourse was just prior to her last menses   After discussion, she is interested in using the combined contraceptive patch No history of DVT or PE No migraine HA No cancer or stroke history   Will have her start on patch now, assuming HCG is negative today   She also needs a refill of her hydrocortisone cream    Patient Active Problem List   Diagnosis Date Noted  . Pre-diabetes 06/10/2017  . Hepatitis B immune 04/30/2016  . Eczema 04/29/2016    No past medical history on file.  No past surgical history on file.  Social History   Tobacco Use  . Smoking status: Never Smoker  . Smokeless tobacco: Never Used  Substance Use Topics  . Alcohol use: No  . Drug use: No    Family History   Problem Relation Age of Onset  . Hypertension Mother   . Migraines Father   . Breast cancer Paternal Grandmother     No Known Allergies  Medication list has been reviewed and updated.  No current outpatient medications on file prior to visit.   No current facility-administered medications on file prior to visit.     Review of Systems:  As per HPI- otherwise negative. No fever or chills No CP or SOB    Physical Examination: Vitals:   06/22/18 1338  BP: 112/68  Pulse: 88  Resp: 16  Temp: 98.1 F (36.7 C)  SpO2: 100%   Vitals:   06/22/18 1338  Weight: 194 lb (88 kg)  Height: 5\' 10"  (1.778 m)   Body mass index is 27.84 kg/m. Ideal Body Weight: Weight in (lb) to have BMI = 25: 173.9  GEN: WDWN, NAD, Non-toxic, A & O x 3, tall build, looks wel HEENT: Atraumatic, Normocephalic. Neck supple. No masses, No LAD.  Bilateral TM wnl, oropharynx normal.  PEERL,EOMI.  Thyroid is enlarged on exam Ears and Nose: No external deformity. CV: RRR, No M/G/R. No JVD. No thrill. No extra heart sounds. PULM: CTA B, no wheezes, crackles, rhonchi. No retractions. No resp. distress. No accessory muscle use. ABD: S, NT, ND, +BS. No rebound. No HSM. EXTR: No c/c/e NEURO Normal gait.  PSYCH: Normally interactive. Conversant. Not depressed or anxious appearing.  Calm demeanor.    Results for orders placed or performed in visit on 06/22/18  POCT urine pregnancy  Result Value Ref Range   Preg Test, Ur Negative Negative    Assessment and Plan: Physical exam - Plan: Flu Vaccine QUAD 6+ mos PF IM (Fluarix Quad PF)  Screening for hyperlipidemia - Plan: Lipid panel  Screening for diabetes mellitus - Plan: Comprehensive metabolic panel, Hemoglobin A1c  Screening for deficiency anemia - Plan: CBC  Goiter - Plan: US THYROID, TSH  Encounter for initial prescription of transdermal patch hormonal contraceptive device - Plan: POCT urine pregnancy, norelgestromin-ethinyl estradiol (ORTHO  EVRA) 150-35 MCG/24HR transdermal patch  CPE today She is overall doing well generally Routine lab pending as above Flu shot given Set up for thyroid US and TSH today  HCG negative- she will start on contraceptive patch, answered all questions about how to use this medication Signed Abbe Amsterdam, MD  Received her labs, message to pt  Results for orders placed or performed in visit on 06/22/18  CBC  Result Value Ref Range   WBC 6.6 4.0 - 10.5 K/uL   RBC 4.59 3.87 - 5.11 Mil/uL   Platelets 356.0 150.0 - 400.0 K/uL   Hemoglobin 11.7 (L) 12.0 - 15.0 g/dL   HCT 55.7 (L) 32.2 - 02.5 %   MCV 78.0 78.0 - 100.0 fl   MCHC 32.6 30.0 - 36.0 g/dL   RDW 42.7 (H) 06.2 - 37.6 %  Comprehensive metabolic panel  Result Value Ref Range   Sodium 139 135 - 145 mEq/L   Potassium 3.7 3.5 - 5.1 mEq/L   Chloride 102 96 - 112 mEq/L   CO2 30 19 - 32 mEq/L   Glucose, Bld 74 70 - 99 mg/dL   BUN 8 6 - 23 mg/dL   Creatinine, Ser 2.83 0.40 - 1.20 mg/dL   Total Bilirubin 0.4 0.2 - 1.2 mg/dL   Alkaline Phosphatase 71 39 - 117 U/L   AST 12 0 - 37 U/L   ALT 8 0 - 35 U/L   Total Protein 7.4 6.0 - 8.3 g/dL   Albumin 4.3 3.5 - 5.2 g/dL   Calcium 9.3 8.4 - 15.1 mg/dL   GFR 761.60 >73.71 mL/min  Hemoglobin A1c  Result Value Ref Range   Hgb A1c MFr Bld 6.2 4.6 - 6.5 %  Lipid panel  Result Value Ref Range   Cholesterol 158 0 - 200 mg/dL   Triglycerides 062.6 (H) 0.0 - 149.0 mg/dL   HDL 94.85 (L) >46.27 mg/dL   VLDL 03.5 0.0 - 00.9 mg/dL   LDL Cholesterol 97 0 - 99 mg/dL   Total CHOL/HDL Ratio 5    NonHDL 127.72   TSH  Result Value Ref Range   TSH 1.12 0.35 - 4.50 uIU/mL  POCT urine pregnancy  Result Value Ref Range   Preg Test, Ur Negative Negative

## 2018-06-22 ENCOUNTER — Ambulatory Visit (INDEPENDENT_AMBULATORY_CARE_PROVIDER_SITE_OTHER): Payer: BLUE CROSS/BLUE SHIELD | Admitting: Family Medicine

## 2018-06-22 ENCOUNTER — Encounter: Payer: Self-pay | Admitting: Family Medicine

## 2018-06-22 ENCOUNTER — Other Ambulatory Visit: Payer: Self-pay | Admitting: Family Medicine

## 2018-06-22 ENCOUNTER — Other Ambulatory Visit: Payer: Self-pay

## 2018-06-22 VITALS — BP 112/68 | HR 88 | Temp 98.1°F | Resp 16 | Ht 70.0 in | Wt 194.0 lb

## 2018-06-22 DIAGNOSIS — Z131 Encounter for screening for diabetes mellitus: Secondary | ICD-10-CM

## 2018-06-22 DIAGNOSIS — E049 Nontoxic goiter, unspecified: Secondary | ICD-10-CM | POA: Diagnosis not present

## 2018-06-22 DIAGNOSIS — Z13220 Encounter for screening for lipoid disorders: Secondary | ICD-10-CM

## 2018-06-22 DIAGNOSIS — Z130 Encounter for screening for diseases of the blood and blood-forming organs and certain disorders involving the immune mechanism: Secondary | ICD-10-CM | POA: Diagnosis not present

## 2018-06-22 DIAGNOSIS — L309 Dermatitis, unspecified: Secondary | ICD-10-CM

## 2018-06-22 DIAGNOSIS — Z0000 Encounter for general adult medical examination without abnormal findings: Secondary | ICD-10-CM | POA: Diagnosis not present

## 2018-06-22 DIAGNOSIS — Z23 Encounter for immunization: Secondary | ICD-10-CM

## 2018-06-22 DIAGNOSIS — Z30016 Encounter for initial prescription of transdermal patch hormonal contraceptive device: Secondary | ICD-10-CM

## 2018-06-22 LAB — CBC
HCT: 35.8 % — ABNORMAL LOW (ref 36.0–46.0)
Hemoglobin: 11.7 g/dL — ABNORMAL LOW (ref 12.0–15.0)
MCHC: 32.6 g/dL (ref 30.0–36.0)
MCV: 78 fl (ref 78.0–100.0)
PLATELETS: 356 10*3/uL (ref 150.0–400.0)
RBC: 4.59 Mil/uL (ref 3.87–5.11)
RDW: 15.7 % — ABNORMAL HIGH (ref 11.5–15.5)
WBC: 6.6 10*3/uL (ref 4.0–10.5)

## 2018-06-22 LAB — LIPID PANEL
Cholesterol: 158 mg/dL (ref 0–200)
HDL: 30.7 mg/dL — ABNORMAL LOW (ref >39.00)
LDL Cholesterol: 97 mg/dL (ref 0–99)
NonHDL: 127.72
Total CHOL/HDL Ratio: 5
Triglycerides: 153 mg/dL — ABNORMAL HIGH (ref 0.0–149.0)
VLDL: 30.6 mg/dL (ref 0.0–40.0)

## 2018-06-22 LAB — COMPREHENSIVE METABOLIC PANEL
ALT: 8 U/L (ref 0–35)
AST: 12 U/L (ref 0–37)
Albumin: 4.3 g/dL (ref 3.5–5.2)
Alkaline Phosphatase: 71 U/L (ref 39–117)
BILIRUBIN TOTAL: 0.4 mg/dL (ref 0.2–1.2)
BUN: 8 mg/dL (ref 6–23)
CALCIUM: 9.3 mg/dL (ref 8.4–10.5)
CO2: 30 meq/L (ref 19–32)
CREATININE: 0.67 mg/dL (ref 0.40–1.20)
Chloride: 102 mEq/L (ref 96–112)
GFR: 130.39 mL/min (ref >60.00)
Glucose, Bld: 74 mg/dL (ref 70–99)
Potassium: 3.7 mEq/L (ref 3.5–5.1)
Sodium: 139 mEq/L (ref 135–145)
Total Protein: 7.4 g/dL (ref 6.0–8.3)

## 2018-06-22 LAB — TSH: TSH: 1.12 u[IU]/mL (ref 0.35–4.50)

## 2018-06-22 LAB — POCT URINE PREGNANCY: Preg Test, Ur: NEGATIVE

## 2018-06-22 LAB — HEMOGLOBIN A1C: Hgb A1c MFr Bld: 6.2 % (ref 4.6–6.5)

## 2018-06-22 MED ORDER — NORELGESTROMIN-ETH ESTRADIOL 150-35 MCG/24HR TD PTWK: 1.0000 | MEDICATED_PATCH | TRANSDERMAL | 12 refills | Status: DC

## 2018-06-22 MED ORDER — HYDROCORTISONE 2.5 % EX OINT: TOPICAL_OINTMENT | Freq: Two times a day (BID) | CUTANEOUS | 2 refills | Status: DC

## 2018-06-22 NOTE — Patient Instructions (Addendum)
It was good to see you today!   Please go to the lab- I will be in touch with your results ASAP You can start on the birth control patch now.  Apply 1 patch, and change it weekly for 3 weeks.  The fourth week you will not use a patch, this is when you will have a menstrual bleed.  Please let me know if any concerns or questions about using this birth control method.  I would still encourage you to use condoms to help prevent disease, as the patch does not protect against STDs. If you do not like this birth control method, please to alert me and we can come up with a different option for you.    Your thyroid does seem enlarged today.  I will set you up for an ultrasound to take a closer look.  Health Maintenance, Female Adopting a healthy lifestyle and getting preventive care can go a long way to promote health and wellness. Talk with your health care provider about what schedule of regular examinations is right for you. This is a good chance for you to check in with your provider about disease prevention and staying healthy. In between checkups, there are plenty of things you can do on your own. Experts have done a lot of research about which lifestyle changes and preventive measures are most likely to keep you healthy. Ask your health care provider for more information. Weight and diet Eat a healthy diet  Be sure to include plenty of vegetables, fruits, low-fat dairy products, and lean protein.  Do not eat a lot of foods high in solid fats, added sugars, or salt.  Get regular exercise. This is one of the most important things you can do for your health. ? Most adults should exercise for at least 150 minutes each week. The exercise should increase your heart rate and make you sweat (moderate-intensity exercise). ? Most adults should also do strengthening exercises at least twice a week. This is in addition to the moderate-intensity exercise. Maintain a healthy weight  Body mass index (BMI) is  a measurement that can be used to identify possible weight problems. It estimates body fat based on height and weight. Your health care provider can help determine your BMI and help you achieve or maintain a healthy weight.  For females 60 years of age and older: ? A BMI below 18.5 is considered underweight. ? A BMI of 18.5 to 24.9 is normal. ? A BMI of 25 to 29.9 is considered overweight. ? A BMI of 30 and above is considered obese. Watch levels of cholesterol and blood lipids  You should start having your blood tested for lipids and cholesterol at 25 years of age, then have this test every 5 years.  You may need to have your cholesterol levels checked more often if: ? Your lipid or cholesterol levels are high. ? You are older than 25 years of age. ? You are at high risk for heart disease. Cancer screening Lung Cancer  Lung cancer screening is recommended for adults 40-62 years old who are at high risk for lung cancer because of a history of smoking.  A yearly low-dose CT scan of the lungs is recommended for people who: ? Currently smoke. ? Have quit within the past 15 years. ? Have at least a 30-pack-year history of smoking. A pack year is smoking an average of one pack of cigarettes a day for 1 year.  Yearly screening should continue until it  has been 15 years since you quit.  Yearly screening should stop if you develop a health problem that would prevent you from having lung cancer treatment. Breast Cancer  Practice breast self-awareness. This means understanding how your breasts normally appear and feel.  It also means doing regular breast self-exams. Let your health care provider know about any changes, no matter how small.  If you are in your 20s or 30s, you should have a clinical breast exam (CBE) by a health care provider every 1-3 years as part of a regular health exam.  If you are 31 or older, have a CBE every year. Also consider having a breast X-ray (mammogram) every  year.  If you have a family history of breast cancer, talk to your health care provider about genetic screening.  If you are at high risk for breast cancer, talk to your health care provider about having an MRI and a mammogram every year.  Breast cancer gene (BRCA) assessment is recommended for women who have family members with BRCA-related cancers. BRCA-related cancers include: ? Breast. ? Ovarian. ? Tubal. ? Peritoneal cancers.  Results of the assessment will determine the need for genetic counseling and BRCA1 and BRCA2 testing. Cervical Cancer Your health care provider may recommend that you be screened regularly for cancer of the pelvic organs (ovaries, uterus, and vagina). This screening involves a pelvic examination, including checking for microscopic changes to the surface of your cervix (Pap test). You may be encouraged to have this screening done every 3 years, beginning at age 46.  For women ages 22-65, health care providers may recommend pelvic exams and Pap testing every 3 years, or they may recommend the Pap and pelvic exam, combined with testing for human papilloma virus (HPV), every 5 years. Some types of HPV increase your risk of cervical cancer. Testing for HPV may also be done on women of any age with unclear Pap test results.  Other health care providers may not recommend any screening for nonpregnant women who are considered low risk for pelvic cancer and who do not have symptoms. Ask your health care provider if a screening pelvic exam is right for you.  If you have had past treatment for cervical cancer or a condition that could lead to cancer, you need Pap tests and screening for cancer for at least 20 years after your treatment. If Pap tests have been discontinued, your risk factors (such as having a new sexual partner) need to be reassessed to determine if screening should resume. Some women have medical problems that increase the chance of getting cervical cancer. In  these cases, your health care provider may recommend more frequent screening and Pap tests. Colorectal Cancer  This type of cancer can be detected and often prevented.  Routine colorectal cancer screening usually begins at 25 years of age and continues through 25 years of age.  Your health care provider may recommend screening at an earlier age if you have risk factors for colon cancer.  Your health care provider may also recommend using home test kits to check for hidden blood in the stool.  A small camera at the end of a tube can be used to examine your colon directly (sigmoidoscopy or colonoscopy). This is done to check for the earliest forms of colorectal cancer.  Routine screening usually begins at age 78.  Direct examination of the colon should be repeated every 5-10 years through 25 years of age. However, you may need to be screened more often  if early forms of precancerous polyps or small growths are found. Skin Cancer  Check your skin from head to toe regularly.  Tell your health care provider about any new moles or changes in moles, especially if there is a change in a mole's shape or color.  Also tell your health care provider if you have a mole that is larger than the size of a pencil eraser.  Always use sunscreen. Apply sunscreen liberally and repeatedly throughout the day.  Protect yourself by wearing long sleeves, pants, a wide-brimmed hat, and sunglasses whenever you are outside. Heart disease, diabetes, and high blood pressure  High blood pressure causes heart disease and increases the risk of stroke. High blood pressure is more likely to develop in: ? People who have blood pressure in the high end of the normal range (130-139/85-89 mm Hg). ? People who are overweight or obese. ? People who are African American.  If you are 15-50 years of age, have your blood pressure checked every 3-5 years. If you are 59 years of age or older, have your blood pressure checked  every year. You should have your blood pressure measured twice-once when you are at a hospital or clinic, and once when you are not at a hospital or clinic. Record the average of the two measurements. To check your blood pressure when you are not at a hospital or clinic, you can use: ? An automated blood pressure machine at a pharmacy. ? A home blood pressure monitor.  If you are between 79 years and 82 years old, ask your health care provider if you should take aspirin to prevent strokes.  Have regular diabetes screenings. This involves taking a blood sample to check your fasting blood sugar level. ? If you are at a normal weight and have a low risk for diabetes, have this test once every three years after 25 years of age. ? If you are overweight and have a high risk for diabetes, consider being tested at a younger age or more often. Preventing infection Hepatitis B  If you have a higher risk for hepatitis B, you should be screened for this virus. You are considered at high risk for hepatitis B if: ? You were born in a country where hepatitis B is common. Ask your health care provider which countries are considered high risk. ? Your parents were born in a high-risk country, and you have not been immunized against hepatitis B (hepatitis B vaccine). ? You have HIV or AIDS. ? You use needles to inject street drugs. ? You live with someone who has hepatitis B. ? You have had sex with someone who has hepatitis B. ? You get hemodialysis treatment. ? You take certain medicines for conditions, including cancer, organ transplantation, and autoimmune conditions. Hepatitis C  Blood testing is recommended for: ? Everyone born from [birth year] through 1967. ? Anyone with known risk factors for hepatitis C. Sexually transmitted infections (STIs)  You should be screened for sexually transmitted infections (STIs) including gonorrhea and chlamydia if: ? You are sexually active and are younger than 25 years of  age. ? You are older than 25 years of age and your health care provider tells you that you are at risk for this type of infection. ? Your sexual activity has changed since you were last screened and you are at an increased risk for chlamydia or gonorrhea. Ask your health care provider if you are at risk.  If you do not have HIV, but are  at risk, it may be recommended that you take a prescription medicine daily to prevent HIV infection. This is called pre-exposure prophylaxis (PrEP). You are considered at risk if: ? You are sexually active and do not regularly use condoms or know the HIV status of your partner(s). ? You take drugs by injection. ? You are sexually active with a partner who has HIV. Talk with your health care provider about whether you are at high risk of being infected with HIV. If you choose to begin PrEP, you should first be tested for HIV. You should then be tested every 3 months for as long as you are taking PrEP. Pregnancy  If you are premenopausal and you may become pregnant, ask your health care provider about preconception counseling.  If you may become pregnant, take 400 to 800 micrograms (mcg) of folic acid every day.  If you want to prevent pregnancy, talk to your health care provider about birth control (contraception). Osteoporosis and menopause  Osteoporosis is a disease in which the bones lose minerals and strength with aging. This can result in serious bone fractures. Your risk for osteoporosis can be identified using a bone density scan.  If you are 38 years of age or older, or if you are at risk for osteoporosis and fractures, ask your health care provider if you should be screened.  Ask your health care provider whether you should take a calcium or vitamin D supplement to lower your risk for osteoporosis.  Menopause may have certain physical symptoms and risks.  Hormone replacement therapy may reduce some of these symptoms and risks. Talk to your health  care provider about whether hormone replacement therapy is right for you. Follow these instructions at home:  Schedule regular health, dental, and eye exams.  Stay current with your immunizations.  Do not use any tobacco products including cigarettes, chewing tobacco, or electronic cigarettes.  If you are pregnant, do not drink alcohol.  If you are breastfeeding, limit how much and how often you drink alcohol.  Limit alcohol intake to no more than 1 drink per day for nonpregnant women. One drink equals 12 ounces of beer, 5 ounces of wine, or 1 ounces of hard liquor.  Do not use street drugs.  Do not share needles.  Ask your health care provider for help if you need support or information about quitting drugs.  Tell your health care provider if you often feel depressed.  Tell your health care provider if you have ever been abused or do not feel safe at home. This information is not intended to replace advice given to you by your health care provider. Make sure you discuss any questions you have with your health care provider. Document Released: 10/01/2010 Document Revised: 08/24/2015 Document Reviewed: 12/20/2014 [employer]vier Interactive Patient Education  2019 [employer].

## 2018-08-20 ENCOUNTER — Other Ambulatory Visit: Payer: BLUE CROSS/BLUE SHIELD

## 2018-08-25 ENCOUNTER — Ambulatory Visit
Admission: RE | Admit: 2018-08-25 | Discharge: 2018-08-25 | Disposition: A | Discharge status: Home / Self Care | Payer: BLUE CROSS/BLUE SHIELD | Source: Ambulatory Visit | Attending: Family Medicine | Admitting: Family Medicine

## 2018-08-25 DIAGNOSIS — E049 Nontoxic goiter, unspecified: Secondary | ICD-10-CM | POA: Diagnosis not present

## 2018-08-26 ENCOUNTER — Encounter: Payer: Self-pay | Admitting: Family Medicine

## 2018-09-01 ENCOUNTER — Other Ambulatory Visit: Payer: BLUE CROSS/BLUE SHIELD

## 2018-10-23 DIAGNOSIS — Z20828 Contact with and (suspected) exposure to other viral communicable diseases: Secondary | ICD-10-CM | POA: Diagnosis not present

## 2019-01-18 ENCOUNTER — Encounter: Payer: Self-pay | Admitting: Family Medicine

## 2019-01-18 DIAGNOSIS — Z30016 Encounter for initial prescription of transdermal patch hormonal contraceptive device: Secondary | ICD-10-CM

## 2019-01-19 MED ORDER — NORELGESTROMIN-ETH ESTRADIOL 150-35 MCG/24HR TD PTWK: 1.0000 | MEDICATED_PATCH | TRANSDERMAL | 3 refills | Status: AC

## 2019-01-19 MED ORDER — NORELGESTROMIN-ETH ESTRADIOL 150-35 MCG/24HR TD PTWK: 1.0000 | MEDICATED_PATCH | TRANSDERMAL | 12 refills | Status: DC

## 2019-01-19 NOTE — Addendum Note (Signed)
Addended by: Lamar Blinks C on: 01/19/2019 06:35 AM   Modules accepted: Orders

## 2019-06-23 ENCOUNTER — Encounter: Payer: BLUE CROSS/BLUE SHIELD | Admitting: Family Medicine
